# Patient Record
Sex: Male | Born: 1970 | Race: White | Hispanic: No | Marital: Single | Smoking: Current every day smoker
Health system: Southern US, Community
[De-identification: ages and names within clinical notes are randomized; demographics above are authoritative.]

## PROBLEM LIST (undated history)

## (undated) DIAGNOSIS — K219 Gastro-esophageal reflux disease without esophagitis: Secondary | ICD-10-CM

## (undated) DIAGNOSIS — R05 Cough: Secondary | ICD-10-CM

## (undated) DIAGNOSIS — IMO0002 Reserved for concepts with insufficient information to code with codable children: Secondary | ICD-10-CM

## (undated) DIAGNOSIS — C801 Malignant (primary) neoplasm, unspecified: Secondary | ICD-10-CM

## (undated) DIAGNOSIS — R059 Cough, unspecified: Secondary | ICD-10-CM

## (undated) DIAGNOSIS — I10 Essential (primary) hypertension: Secondary | ICD-10-CM

## (undated) DIAGNOSIS — G479 Sleep disorder, unspecified: Secondary | ICD-10-CM

## (undated) HISTORY — PX: APPENDECTOMY: SHX54

## (undated) HISTORY — PX: MELANOMA EXCISION: SHX5266

---

## 2011-11-11 ENCOUNTER — Other Ambulatory Visit: Payer: Self-pay | Admitting: Specialist

## 2011-11-20 ENCOUNTER — Encounter (HOSPITAL_COMMUNITY): Payer: Self-pay | Admitting: Pharmacy Technician

## 2011-11-23 ENCOUNTER — Ambulatory Visit (HOSPITAL_COMMUNITY)
Admission: RE | Admit: 2011-11-23 | Discharge: 2011-11-23 | Disposition: A | Discharge status: Home / Self Care | Payer: Worker's Compensation | Source: Ambulatory Visit | Attending: Specialist | Admitting: Specialist

## 2011-11-23 ENCOUNTER — Encounter (HOSPITAL_COMMUNITY): Payer: Self-pay

## 2011-11-23 ENCOUNTER — Other Ambulatory Visit: Payer: Self-pay

## 2011-11-23 ENCOUNTER — Encounter (HOSPITAL_COMMUNITY)
Admission: RE | Admit: 2011-11-23 | Discharge: 2011-11-23 | Disposition: A | Discharge status: Home / Self Care | Payer: Worker's Compensation | Source: Ambulatory Visit | Attending: Specialist | Admitting: Specialist

## 2011-11-23 DIAGNOSIS — Z01818 Encounter for other preprocedural examination: Secondary | ICD-10-CM | POA: Insufficient documentation

## 2011-11-23 DIAGNOSIS — Z01812 Encounter for preprocedural laboratory examination: Secondary | ICD-10-CM | POA: Insufficient documentation

## 2011-11-23 HISTORY — DX: Cough, unspecified: R05.9

## 2011-11-23 HISTORY — DX: Essential (primary) hypertension: I10

## 2011-11-23 HISTORY — DX: Malignant (primary) neoplasm, unspecified: C80.1

## 2011-11-23 HISTORY — DX: Gastro-esophageal reflux disease without esophagitis: K21.9

## 2011-11-23 HISTORY — DX: Sleep disorder, unspecified: G47.9

## 2011-11-23 HISTORY — DX: Cough: R05

## 2011-11-23 HISTORY — DX: Reserved for concepts with insufficient information to code with codable children: IMO0002

## 2011-11-23 LAB — BASIC METABOLIC PANEL
CO2: 23 mEq/L (ref 19–32)
Calcium: 9.8 mg/dL (ref 8.4–10.5)
Chloride: 100 mEq/L (ref 96–112)
Potassium: 4.3 mEq/L (ref 3.5–5.1)
Sodium: 136 mEq/L (ref 135–145)

## 2011-11-23 LAB — CBC
Hemoglobin: 15.1 g/dL (ref 13.0–17.0)
MCH: 29.5 pg (ref 26.0–34.0)
Platelets: 275 10*3/uL (ref 150–400)
RBC: 5.11 MIL/uL (ref 4.22–5.81)
WBC: 9.2 10*3/uL (ref 4.0–10.5)

## 2011-11-23 LAB — SURGICAL PCR SCREEN
MRSA, PCR: POSITIVE — AB
Staphylococcus aureus: POSITIVE — AB

## 2011-11-23 NOTE — Progress Notes (Signed)
11/23/11 1110  OBSTRUCTIVE SLEEP APNEA  Have you ever been diagnosed with sleep apnea through a sleep study? No  Do you snore loudly (loud enough to be heard through closed doors)?  1  Do you often feel tired, fatigued, or sleepy during the daytime? 1  Has anyone observed you stop breathing during your sleep? 0  Do you have, or are you being treated for high blood pressure? 1  BMI more than 35 kg/m2? 0  Age over 40 years old? 0  Neck circumference greater than 40 cm/18 inches? 0  Gender: 1  Obstructive Sleep Apnea Score 4   Score 4 or greater  No PCP

## 2011-11-23 NOTE — Patient Instructions (Signed)
Sean Mccann  11/23/2011                           YOUR PROCEDURE IS SCHEDULED ON:  11/29/11 AT 8:30 AM               PLEASE REPORT TO SHORT STAY CENTER AT :  6:00 AM               CALL THIS NUMBER IF ANY PROBLEMS THE DAY OF SURGERY :               [phone]                      REMEMBER:   Do not eat food or drink liquids AFTER MIDNIGHT    Take these medicines the morning of surgery with A SIP OF WATER:  WELLBUTRIN / RANITIDINE / HYDROCODONE IF NEEDED   Do not wear jewelry, make-up   Do not wear lotions, powders, or perfumes.   Do not shave legs or underarms 12 hrs. before surgery (men may shave face)  Do not bring valuables to the hospital.  Contacts, dentures or bridgework may not be worn into surgery.  Leave suitcase in the car. After surgery it may be brought to your room.  For patients admitted to the hospital more than one night, checkout time is 11:00                          The day of discharge.   Patients discharged the day of surgery will not be allowed to drive home                             If going home same day of surgery, must have someone stay with you first                           24 hrs at home and arrange for some one to drive you home from hospital.    Special Instructions:   Please read over the following fact sheets that you were given:               1. MRSA  INFORMATION                      2. [place] PREPARING FOR SURGERY SHEET               3. INCENTIVE SPIROMETER                                                            X_____________________________________________________________________

## 2011-11-29 ENCOUNTER — Ambulatory Visit (HOSPITAL_COMMUNITY): Payer: Worker's Compensation

## 2011-11-29 ENCOUNTER — Encounter (HOSPITAL_COMMUNITY)
Admission: RE | Disposition: A | Discharge status: Home / Self Care | Payer: Self-pay | Source: Ambulatory Visit | Attending: Specialist

## 2011-11-29 ENCOUNTER — Observation Stay (HOSPITAL_COMMUNITY)
Admission: RE | Admit: 2011-11-29 | Discharge: 2011-11-29 | Disposition: A | Discharge status: Home / Self Care | Payer: Worker's Compensation | Source: Ambulatory Visit | Attending: Specialist | Admitting: Specialist

## 2011-11-29 ENCOUNTER — Ambulatory Visit (HOSPITAL_COMMUNITY): Payer: Worker's Compensation | Admitting: Anesthesiology

## 2011-11-29 ENCOUNTER — Encounter (HOSPITAL_COMMUNITY): Payer: Self-pay | Admitting: *Deleted

## 2011-11-29 ENCOUNTER — Encounter (HOSPITAL_COMMUNITY): Payer: Self-pay | Admitting: Anesthesiology

## 2011-11-29 DIAGNOSIS — I10 Essential (primary) hypertension: Secondary | ICD-10-CM | POA: Insufficient documentation

## 2011-11-29 DIAGNOSIS — Z85820 Personal history of malignant melanoma of skin: Secondary | ICD-10-CM | POA: Insufficient documentation

## 2011-11-29 DIAGNOSIS — F17200 Nicotine dependence, unspecified, uncomplicated: Secondary | ICD-10-CM | POA: Insufficient documentation

## 2011-11-29 DIAGNOSIS — M5126 Other intervertebral disc displacement, lumbar region: Principal | ICD-10-CM | POA: Insufficient documentation

## 2011-11-29 DIAGNOSIS — K219 Gastro-esophageal reflux disease without esophagitis: Secondary | ICD-10-CM | POA: Insufficient documentation

## 2011-11-29 DIAGNOSIS — Z79899 Other long term (current) drug therapy: Secondary | ICD-10-CM | POA: Insufficient documentation

## 2011-11-29 HISTORY — PX: LUMBAR LAMINECTOMY: SHX95

## 2011-11-29 SURGERY — MICRODISCECTOMY LUMBAR LAMINECTOMY
Anesthesia: General | Laterality: Right | Wound class: Clean

## 2011-11-29 MED ORDER — LISINOPRIL 10 MG PO TABS
10.0000 mg | ORAL_TABLET | Freq: Every day | ORAL | Status: DC
  Administered 2011-11-29: 10 mg via ORAL
  Filled 2011-11-29: qty 1

## 2011-11-29 MED ORDER — CLINDAMYCIN PHOSPHATE 900 MG/50ML IV SOLN
INTRAVENOUS | Status: AC
  Filled 2011-11-29: qty 50

## 2011-11-29 MED ORDER — ACETAMINOPHEN 10 MG/ML IV SOLN
INTRAVENOUS | Status: AC
  Filled 2011-11-29: qty 100

## 2011-11-29 MED ORDER — ONDANSETRON HCL 4 MG/2ML IJ SOLN
INTRAMUSCULAR | Status: DC | PRN
  Administered 2011-11-29 (×2): 2 mg via INTRAVENOUS

## 2011-11-29 MED ORDER — FENTANYL CITRATE 0.05 MG/ML IJ SOLN
INTRAMUSCULAR | Status: DC | PRN
  Administered 2011-11-29 (×2): 25 ug via INTRAVENOUS
  Administered 2011-11-29: 75 ug via INTRAVENOUS
  Administered 2011-11-29: 50 ug via INTRAVENOUS
  Administered 2011-11-29: 25 ug via INTRAVENOUS
  Administered 2011-11-29: 50 ug via INTRAVENOUS

## 2011-11-29 MED ORDER — CEFAZOLIN SODIUM-DEXTROSE 2-3 GM-% IV SOLR
INTRAVENOUS | Status: AC
  Filled 2011-11-29: qty 50

## 2011-11-29 MED ORDER — METHOCARBAMOL 500 MG PO TABS: 500.0000 mg | ORAL_TABLET | Freq: Four times a day (QID) | ORAL | Status: DC | PRN

## 2011-11-29 MED ORDER — THROMBIN 5000 UNITS EX SOLR
CUTANEOUS | Status: DC | PRN
  Administered 2011-11-29: 10000 [IU] via TOPICAL

## 2011-11-29 MED ORDER — METHOCARBAMOL 100 MG/ML IJ SOLN
500.0000 mg | Freq: Four times a day (QID) | INTRAVENOUS | Status: DC | PRN
  Administered 2011-11-29: 500 mg via INTRAVENOUS
  Filled 2011-11-29: qty 5

## 2011-11-29 MED ORDER — CEFAZOLIN SODIUM-DEXTROSE 2-3 GM-% IV SOLR
2.0000 g | Freq: Three times a day (TID) | INTRAVENOUS | Status: DC
  Administered 2011-11-29: 2 g via INTRAVENOUS
  Filled 2011-11-29 (×2): qty 50

## 2011-11-29 MED ORDER — CEFAZOLIN SODIUM-DEXTROSE 2-3 GM-% IV SOLR
2.0000 g | INTRAVENOUS | Status: AC
  Administered 2011-11-29: 2 g via INTRAVENOUS

## 2011-11-29 MED ORDER — CLINDAMYCIN PHOSPHATE 900 MG/50ML IV SOLN
900.0000 mg | Freq: Once | INTRAVENOUS | Status: AC
  Administered 2011-11-29: 900 mg via INTRAVENOUS
  Filled 2011-11-29: qty 50

## 2011-11-29 MED ORDER — MENTHOL 3 MG MT LOZG
1.0000 | LOZENGE | OROMUCOSAL | Status: DC | PRN
  Filled 2011-11-29: qty 9

## 2011-11-29 MED ORDER — BUPROPION HCL ER (XL) 300 MG PO TB24: 300.0000 mg | ORAL_TABLET | Freq: Every day | ORAL | Status: DC

## 2011-11-29 MED ORDER — PANTOPRAZOLE SODIUM 40 MG IV SOLR
40.0000 mg | Freq: Every day | INTRAVENOUS | Status: DC
  Administered 2011-11-29: 40 mg via INTRAVENOUS
  Filled 2011-11-29: qty 40

## 2011-11-29 MED ORDER — DOCUSATE SODIUM 100 MG PO CAPS
100.0000 mg | ORAL_CAPSULE | Freq: Two times a day (BID) | ORAL | Status: DC
  Administered 2011-11-29: 100 mg via ORAL
  Filled 2011-11-29 (×2): qty 1

## 2011-11-29 MED ORDER — PROPOFOL 10 MG/ML IV EMUL
INTRAVENOUS | Status: DC | PRN
  Administered 2011-11-29: 250 mg via INTRAVENOUS

## 2011-11-29 MED ORDER — SODIUM CHLORIDE 0.9 % IR SOLN
Status: DC | PRN
  Administered 2011-11-29: 09:00:00

## 2011-11-29 MED ORDER — OXYCODONE-ACETAMINOPHEN 7.5-325 MG PO TABS: 1.0000 | ORAL_TABLET | ORAL | Status: AC | PRN

## 2011-11-29 MED ORDER — KETOROLAC TROMETHAMINE 30 MG/ML IJ SOLN: 15.0000 mg | Freq: Once | INTRAMUSCULAR | Status: DC | PRN

## 2011-11-29 MED ORDER — ACETAMINOPHEN 10 MG/ML IV SOLN
INTRAVENOUS | Status: DC | PRN
  Administered 2011-11-29: 1000 mg via INTRAVENOUS

## 2011-11-29 MED ORDER — ACETAMINOPHEN 325 MG PO TABS: 650.0000 mg | ORAL_TABLET | ORAL | Status: DC | PRN

## 2011-11-29 MED ORDER — CISATRACURIUM BESYLATE (PF) 10 MG/5ML IV SOLN
INTRAVENOUS | Status: DC | PRN
  Administered 2011-11-29: 1 mg via INTRAVENOUS
  Administered 2011-11-29: 5 mg via INTRAVENOUS
  Administered 2011-11-29: 2 mg via INTRAVENOUS
  Administered 2011-11-29 (×2): 1 mg via INTRAVENOUS

## 2011-11-29 MED ORDER — MIDAZOLAM HCL 5 MG/5ML IJ SOLN
INTRAMUSCULAR | Status: DC | PRN
  Administered 2011-11-29: 2 mg via INTRAVENOUS

## 2011-11-29 MED ORDER — PROMETHAZINE HCL 25 MG/ML IJ SOLN: 6.2500 mg | INTRAMUSCULAR | Status: DC | PRN

## 2011-11-29 MED ORDER — CLINDAMYCIN PHOSPHATE 900 MG/50ML IV SOLN: INTRAVENOUS | Status: DC | PRN

## 2011-11-29 MED ORDER — HYDROMORPHONE HCL PF 1 MG/ML IJ SOLN: 0.5000 mg | INTRAMUSCULAR | Status: DC | PRN

## 2011-11-29 MED ORDER — ACETAMINOPHEN 650 MG RE SUPP: 650.0000 mg | RECTAL | Status: DC | PRN

## 2011-11-29 MED ORDER — HYDROMORPHONE HCL PF 1 MG/ML IJ SOLN
INTRAMUSCULAR | Status: DC | PRN
  Administered 2011-11-29: 0.5 mg via INTRAVENOUS
  Administered 2011-11-29: 1.5 mg via INTRAVENOUS

## 2011-11-29 MED ORDER — LIDOCAINE HCL (CARDIAC) 20 MG/ML IV SOLN
INTRAVENOUS | Status: DC | PRN
  Administered 2011-11-29: 30 mg via INTRAVENOUS

## 2011-11-29 MED ORDER — OXYCODONE-ACETAMINOPHEN 5-325 MG PO TABS
1.0000 | ORAL_TABLET | ORAL | Status: DC | PRN
  Administered 2011-11-29: 2 via ORAL
  Filled 2011-11-29: qty 2

## 2011-11-29 MED ORDER — METHOCARBAMOL 500 MG PO TABS: 500.0000 mg | ORAL_TABLET | Freq: Three times a day (TID) | ORAL | Status: AC

## 2011-11-29 MED ORDER — SUCCINYLCHOLINE CHLORIDE 20 MG/ML IJ SOLN
INTRAMUSCULAR | Status: DC | PRN
  Administered 2011-11-29: 100 mg via INTRAVENOUS

## 2011-11-29 MED ORDER — THROMBIN 5000 UNITS EX SOLR
CUTANEOUS | Status: AC
Start: 2011-11-29 — End: 2011-11-29
  Filled 2011-11-29: qty 10000

## 2011-11-29 MED ORDER — CLINDAMYCIN PHOSPHATE 600 MG/50ML IV SOLN
600.0000 mg | Freq: Three times a day (TID) | INTRAVENOUS | Status: DC
  Administered 2011-11-29: 600 mg via INTRAVENOUS
  Filled 2011-11-29 (×2): qty 50

## 2011-11-29 MED ORDER — ONDANSETRON HCL 4 MG/2ML IJ SOLN: 4.0000 mg | INTRAMUSCULAR | Status: DC | PRN

## 2011-11-29 MED ORDER — CHLORHEXIDINE GLUCONATE 4 % EX LIQD: 60.0000 mL | Freq: Once | CUTANEOUS | Status: DC

## 2011-11-29 MED ORDER — PHENOL 1.4 % MT LIQD
1.0000 | OROMUCOSAL | Status: DC | PRN
  Filled 2011-11-29: qty 177

## 2011-11-29 MED ORDER — HYDROCODONE-ACETAMINOPHEN 5-325 MG PO TABS
1.0000 | ORAL_TABLET | ORAL | Status: DC | PRN
  Administered 2011-11-29: 2 via ORAL
  Filled 2011-11-29: qty 2

## 2011-11-29 MED ORDER — BUPIVACAINE-EPINEPHRINE 0.5% -1:200000 IJ SOLN
INTRAMUSCULAR | Status: DC | PRN
  Administered 2011-11-29: 30 mL

## 2011-11-29 MED ORDER — LACTATED RINGERS IV SOLN
INTRAVENOUS | Status: DC | PRN
  Administered 2011-11-29 (×2): via INTRAVENOUS

## 2011-11-29 MED ORDER — LACTATED RINGERS IV SOLN: INTRAVENOUS | Status: DC

## 2011-11-29 MED ORDER — HYDROMORPHONE HCL PF 1 MG/ML IJ SOLN: 0.2500 mg | INTRAMUSCULAR | Status: DC | PRN

## 2011-11-29 MED ORDER — BUPIVACAINE-EPINEPHRINE (PF) 0.5% -1:200000 IJ SOLN
INTRAMUSCULAR | Status: AC
  Filled 2011-11-29: qty 10

## 2011-11-29 MED ORDER — SODIUM CHLORIDE 0.45 % IV SOLN: INTRAVENOUS | Status: DC

## 2011-11-29 SURGICAL SUPPLY — 45 items
BAG ZIPLOCK 12X15 (MISCELLANEOUS) ×2 IMPLANT
BENZOIN TINCTURE PRP APPL 2/3 (GAUZE/BANDAGES/DRESSINGS) ×2 IMPLANT
CHLORAPREP W/TINT 26ML (MISCELLANEOUS) IMPLANT
CLEANER TIP ELECTROSURG 2X2 (MISCELLANEOUS) ×2 IMPLANT
CLOTH BEACON ORANGE TIMEOUT ST (SAFETY) ×2 IMPLANT
CLSR STERI-STRIP ANTIMIC 1/2X4 (GAUZE/BANDAGES/DRESSINGS) ×2 IMPLANT
DECANTER SPIKE VIAL GLASS SM (MISCELLANEOUS) ×2 IMPLANT
DRAPE MICROSCOPE LEICA (MISCELLANEOUS) ×2 IMPLANT
DRAPE POUCH INSTRU U-SHP 10X18 (DRAPES) ×2 IMPLANT
DRAPE SURG 17X11 SM STRL (DRAPES) ×2 IMPLANT
DRSG EMULSION OIL 3X3 NADH (GAUZE/BANDAGES/DRESSINGS) IMPLANT
DRSG PAD ABDOMINAL 8X10 ST (GAUZE/BANDAGES/DRESSINGS) IMPLANT
DRSG TELFA 4X5 ISLAND ADH (GAUZE/BANDAGES/DRESSINGS) ×2 IMPLANT
DURAPREP 26ML APPLICATOR (WOUND CARE) ×2 IMPLANT
ELECT REM PT RETURN 9FT ADLT (ELECTROSURGICAL) ×2
ELECTRODE REM PT RTRN 9FT ADLT (ELECTROSURGICAL) ×1 IMPLANT
GLOVE BIOGEL PI IND STRL 8 (GLOVE) ×1 IMPLANT
GLOVE BIOGEL PI INDICATOR 8 (GLOVE) ×1
GLOVE ECLIPSE 6.5 STRL STRAW (GLOVE) ×2 IMPLANT
GLOVE INDICATOR 6.5 STRL GRN (GLOVE) ×2 IMPLANT
GLOVE SURG SS PI 8.0 STRL IVOR (GLOVE) ×4 IMPLANT
GOWN STRL NON-REIN LRG LVL3 (GOWN DISPOSABLE) ×2 IMPLANT
GOWN STRL REIN XL XLG (GOWN DISPOSABLE) ×4 IMPLANT
KIT BASIN OR (CUSTOM PROCEDURE TRAY) ×2 IMPLANT
KIT POSITIONING SURG ANDREWS (MISCELLANEOUS) ×2 IMPLANT
MANIFOLD NEPTUNE II (INSTRUMENTS) ×2 IMPLANT
NEEDLE SPNL 18GX3.5 QUINCKE PK (NEEDLE) ×6 IMPLANT
PATTIES SURGICAL .5 X.5 (GAUZE/BANDAGES/DRESSINGS) IMPLANT
PATTIES SURGICAL .75X.75 (GAUZE/BANDAGES/DRESSINGS) IMPLANT
PATTIES SURGICAL 1X1 (DISPOSABLE) IMPLANT
SPONGE SURGIFOAM ABS GEL 100 (HEMOSTASIS) ×2 IMPLANT
STAPLER VISISTAT (STAPLE) IMPLANT
STRIP CLOSURE SKIN 1/2X4 (GAUZE/BANDAGES/DRESSINGS) IMPLANT
SUT PROLENE 3 0 PS 2 (SUTURE) IMPLANT
SUT VIC AB 0 CT1 27 (SUTURE)
SUT VIC AB 0 CT1 27XBRD ANTBC (SUTURE) IMPLANT
SUT VIC AB 1 CT1 27 (SUTURE) ×2
SUT VIC AB 1 CT1 27XBRD ANTBC (SUTURE) ×2 IMPLANT
SUT VIC AB 1-0 CT2 27 (SUTURE) IMPLANT
SUT VIC AB 2-0 CT1 27 (SUTURE) ×1
SUT VIC AB 2-0 CT1 TAPERPNT 27 (SUTURE) ×1 IMPLANT
SUT VICRYL 0 UR6 27IN ABS (SUTURE) IMPLANT
SYR CONTROL 10ML LL (SYRINGE) ×2 IMPLANT
TRAY LAMINECTOMY (CUSTOM PROCEDURE TRAY) ×2 IMPLANT
YANKAUER SUCT BULB TIP NO VENT (SUCTIONS) ×2 IMPLANT

## 2011-11-29 NOTE — Brief Op Note (Signed)
11/29/2011  10:07 AM  PATIENT:  Sean Mccann  41 y.o. 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  PROCEDURE:  Procedure(s) (LRB) with comments: MICRODISCECTOMY LUMBAR LAMINECTOMY (Right) - L5-S1 RIGHT  SURGEON:  Surgeon(s) and Role:    * Javier Docker, MD - Primary  PHYSICIAN ASSISTANT:   ASSISTANTS: Irish Elders   ANESTHESIA:   general  EBL:     BLOOD ADMINISTERED:none  DRAINS: none   LOCAL MEDICATIONS USED:  MARCAINE     SPECIMEN:  Source of Specimen:  [id number]  DISPOSITION OF SPECIMEN:  PATHOLOGY  COUNTS:  YES  TOURNIQUET:  * No tourniquets in log *  DICTATION: .Other Dictation: Dictation Number [id number]  PLAN OF CARE: Admit for overnight observation  PATIENT DISPOSITION:  PACU - hemodynamically stable.   Delay start of Pharmacological VTE agent (>24hrs) due to surgical blood loss or risk of bleeding: yes

## 2011-11-29 NOTE — Care Management Note (Signed)
    Page 1 of 1   11/29/2011     4:11:24 PM   CARE MANAGEMENT NOTE 11/29/2011  Patient:  MONTAE, STAGER   Account Number:  [id number]  Date Initiated:  11/29/2011  Documentation initiated by:  Lorenda Ishihara  Subjective/Objective Assessment:   41 yo male admitted s/p laminectomy     Action/Plan:   home when stable   Anticipated DC Date:  11/29/2011   Anticipated DC Plan:  HOME/SELF CARE      DC Planning Services  CM consult      Choice offered to / List presented to:             Status of service:  Completed, signed off Medicare Important Message given?   (If response is "NO", the following Medicare IM given date fields will be blank) Date Medicare IM given:   Date Additional Medicare IM given:    Discharge Disposition:  HOME/SELF CARE  Per UR Regulation:  Reviewed for med. necessity/level of care/duration of stay  If discussed at Long Length of Stay Meetings, dates discussed:    Comments:  11-29-11 Lorenda Ishihara RN CM 1600 Mobilizing well. Pt states he plans to start OP PT in 2 weeks after follow-up with MD. No home health needs. 1x eval. PT Assessment,  Patent does not need any further PT services. Patient does not need any further OT services

## 2011-11-29 NOTE — Anesthesia Preprocedure Evaluation (Signed)
Anesthesia Evaluation  Patient identified by MRN, date of birth, ID band Patient awake    Reviewed: Allergy & Precautions, H&P , NPO status , Patient's Chart, lab work & pertinent test results  Airway Mallampati: II TM Distance: >3 FB Neck ROM: Full    Dental No notable dental hx.    Pulmonary Current Smoker,  breath sounds clear to auscultation  Pulmonary exam normal       Cardiovascular hypertension, Pt. on medications Rhythm:Regular Rate:Normal     Neuro/Psych negative neurological ROS  negative psych ROS   GI/Hepatic Neg liver ROS, GERD-  Medicated,  Endo/Other  negative endocrine ROS  Renal/GU negative Renal ROS  negative genitourinary   Musculoskeletal negative musculoskeletal ROS (+)   Abdominal   Peds negative pediatric ROS (+)  Hematology negative hematology ROS (+)   Anesthesia Other Findings   Reproductive/Obstetrics negative OB ROS                           Anesthesia Physical Anesthesia Plan  ASA: II  Anesthesia Plan: General   Post-op Pain Management:    Induction: Intravenous  Airway Management Planned: Oral ETT  Additional Equipment:   Intra-op Plan:   Post-operative Plan: Extubation in OR  Informed Consent: I have reviewed the patients History and Physical, chart, labs and discussed the procedure including the risks, benefits and alternatives for the proposed anesthesia with the patient or authorized representative who has indicated his/her understanding and acceptance.   Dental advisory given  Plan Discussed with: CRNA and Surgeon  Anesthesia Plan Comments:         Anesthesia Quick Evaluation

## 2011-11-29 NOTE — Anesthesia Postprocedure Evaluation (Signed)
  Anesthesia Post-op Note  Patient: Sean Mccann  Procedure(s) Performed: Procedure(s) (LRB): MICRODISCECTOMY LUMBAR LAMINECTOMY (Right)  Patient Location: PACU  Anesthesia Type: General  Level of Consciousness: awake and alert   Airway and Oxygen Therapy: Patient Spontanous Breathing  Post-op Pain: mild  Post-op Assessment: Post-op Vital signs reviewed, Patient's Cardiovascular Status Stable, Respiratory Function Stable, Patent Airway and No signs of Nausea or vomiting  Post-op Vital Signs: stable  Complications: No apparent anesthesia complications

## 2011-11-29 NOTE — Evaluation (Signed)
Occupational Therapy Evaluation Patient Details Name: Sean Mccann MRN: [id number] DOB: 05-03-1970 Today's Date: 11/29/2011 Time: 4098-1191 OT Time Calculation (min): 19 min  OT Assessment / Plan / Recommendation Clinical Impression  This 41 year old man was admitted for L5-S1 decompression.  All education was completed and pt verbalizes all.  He does not need DME nor follow up OT    OT Assessment  Patient does not need any further OT services    Follow Up Recommendations  No OT follow up    Barriers to Discharge      Equipment Recommendations  None recommended by PT/OT    Recommendations for Other Services    Frequency       Precautions / Restrictions Precautions Precautions: Back Precaution Booklet Issued: Yes (comment) Precaution Comments: Verbally reviewed and demonstrated back precautions.  Restrictions Weight Bearing Restrictions: No   Pertinent Vitals/Pain Back sore only--repositioned    ADL  Grooming: Simulated;Supervision/safety Where Assessed - Grooming: Unsupported standing Upper Body Bathing: Simulated;Set up Where Assessed - Upper Body Bathing: Unsupported sitting Lower Body Bathing: Simulated;Supervision/safety (with reacher) Where Assessed - Lower Body Bathing: Supported sit to stand Upper Body Dressing: Simulated;Supervision/safety Where Assessed - Upper Body Dressing: Unsupported sitting Lower Body Dressing: Performed;Moderate assistance Where Assessed - Lower Body Dressing: Unsupported sit to stand Toilet Transfer: Performed;Supervision/safety Toilet Transfer Method: Sit to Barista: Comfort height toilet;Grab bars (has sink next to commode) Toileting - Clothing Manipulation and Hygiene: Simulated;Supervision/safety Where Assessed - Engineer, mining and Hygiene: Sit to stand from 3-in-1 or toilet Tub/Shower Transfer: Simulated;Supervision/safety Tub/Shower Transfer Method: Land:  (tub) Transfers/Ambulation Related to ADLs: supervision ambulating in room ADL Comments: reviewed back precautions with adls, ae, alternative methods to put pants on (supine), and gave handout.  Pt verbalies all education    OT Diagnosis:    OT Problem List:   OT Treatment Interventions:     OT Goals    Visit Information  Last OT Received On: 11/29/11 Assistance Needed: +1 PT/OT Co-Evaluation/Treatment: Yes    Subjective Data  Subjective: I already feel better Patient Stated Goal: get home:  will stay with mother first   Prior Functioning     Home Living Lives With: Alone Type of Home: House Home Access: Stairs to enter Entrance Stairs-Number of Steps: 3 Entrance Stairs-Rails: None Home Layout: One level Bathroom Shower/Tub: Engineer, manufacturing systems: Handicapped height (comfort height) Home Adaptive Equipment: Straight cane Additional Comments: mother's house: 1 lleve, 3 steps + rail.  Prior Function Level of Independence: Independent Able to Take Stairs?: Yes Driving: Yes Vocation: Part time employment Communication Communication: No difficulties         Vision/Perception     Cognition  Overall Cognitive Status: Appears within functional limits for tasks assessed/performed Arousal/Alertness: Awake/alert Orientation Level: Appears intact for tasks assessed Behavior During Session: [facility] for tasks performed    Extremity/Trunk Assessment Right Upper Extremity Assessment RUE ROM/Strength/Tone: [facility] for tasks assessed Left Upper Extremity Assessment LUE ROM/Strength/Tone: WFL for tasks assessed Trunk Assessment Trunk Assessment: Normal     Mobility Bed Mobility Bed Mobility: Rolling Left;Left Sidelying to Sit Rolling Left: 5: Supervision Left Sidelying to Sit: 5: Supervision Details for Bed Mobility Assistance: VCs safety, technique.  Transfers Sit to Stand: 5: Supervision;From bed Stand to Sit: 5: Supervision;To bed Details for  Transfer Assistance: VCs safety, hand placement.      Shoulder Instructions     Exercise     Balance     End  of Session OT - End of Session Activity Tolerance: Patient tolerated treatment well Patient left: in bed;with call bell/phone within reach;with family/visitor present  GO Functional Assessment Tool Used: clinical observation/judgment Functional Limitation: Self care Self Care Current Status (Z6109): At least 20 percent but less than 40 percent impaired, limited or restricted Self Care Goal Status (U0454): At least 20 percent but less than 40 percent impaired, limited or restricted Self Care Discharge Status [phone]): At least 20 percent but less than 40 percent impaired, limited or restricted   Sean Mccann 11/29/2011, 4:01 PM Marica Otter, OTR/L [phone] 11/29/2011

## 2011-11-29 NOTE — Evaluation (Signed)
Physical Therapy Evaluation Patient Details Name: Sean Mccann MRN: [id number] DOB: Apr 02, 1970 Today's Date: 11/29/2011 Time: 7829-5621 PT Time Calculation (min): 23 min  PT Assessment / Plan / Recommendation Clinical Impression  41 yo male s/p lumbar laminectomy L5-S1. POD 0. Mobilizing well. Reviewed all education. Pt states he plans to start OP PT in 2 weeks after follow-up with MD. No home health needs. 1x eval.    PT Assessment  Patent does not need any further PT services    Follow Up Recommendations  No PT follow up    Does the patient have the potential to tolerate intense rehabilitation      Barriers to Discharge        Equipment Recommendations  None recommended by PT    Recommendations for Other Services     Frequency      Precautions / Restrictions Precautions Precautions: Back Precaution Comments: Verbally reviewed and demonstrated back precautions.  Restrictions Weight Bearing Restrictions: No   Pertinent Vitals/Pain 2/10 low back      Mobility  Bed Mobility Bed Mobility: Rolling Left;Left Sidelying to Sit Rolling Left: 5: Supervision Left Sidelying to Sit: 5: Supervision Details for Bed Mobility Assistance: VCs safety, technique.  Transfers Transfers: Sit to Stand;Stand to Sit Sit to Stand: 5: Supervision;From bed Stand to Sit: 5: Supervision;To bed Details for Transfer Assistance: VCs safety, hand placement.  Ambulation/Gait Ambulation/Gait Assistance: 5: Supervision Ambulation Distance (Feet): 200 Feet Assistive device: None Ambulation/Gait Assistance Details: Guarded gait pattern: slow gait speed, little to no arm swing.  Gait Pattern: Step-through pattern Stairs: Yes Stairs Assistance: 5: Supervision Stairs Assistance Details (indicate cue type and reason): VCs safety, technique.  Stair Management Technique: One rail Left;Step to pattern;Forwards Number of Stairs: 3     Shoulder Instructions     Exercises     PT Diagnosis:    PT  Problem List:   PT Treatment Interventions:     PT Goals    Visit Information  Last PT Received On: 11/29/11 Assistance Needed: +1    Subjective Data  Subjective: "It hurts less new than it did before the surgery" Patient Stated Goal: Home.    Prior Functioning  Home Living Lives With: Alone Type of Home: House Home Access: Stairs to enter Entrance Stairs-Number of Steps: 3 Entrance Stairs-Rails: None Home Layout: One level Bathroom Shower/Tub: Tub/shower unit Home Adaptive Equipment: Straight cane Additional Comments: mother's house: 1 lleve, 3 steps + rail.  Prior Function Level of Independence: Independent Able to Take Stairs?: Yes Driving: Yes Vocation: Part time employment Communication Communication: No difficulties    Cognition  Overall Cognitive Status: Appears within functional limits for tasks assessed/performed Arousal/Alertness: Awake/alert Orientation Level: Appears intact for tasks assessed Behavior During Session: [facility] for tasks performed    Extremity/Trunk Assessment Right Lower Extremity Assessment RLE ROM/Strength/Tone: [facility] for tasks assessed RLE Sensation: WFL - Light Touch Left Lower Extremity Assessment LLE ROM/Strength/Tone: WFL for tasks assessed LLE Sensation: WFL - Light Touch Trunk Assessment Trunk Assessment: Normal   Balance    End of Session PT - End of Session Activity Tolerance: Patient tolerated treatment well Patient left: in bed;with call bell/phone within reach;with family/visitor present  GP Functional Assessment Tool Used: Clinical judgement Functional Limitation: Mobility: Walking and moving around Mobility: Walking and Moving Around Current Status (H0865): At least 1 percent but less than 20 percent impaired, limited or restricted Mobility: Walking and Moving Around Goal Status [phone]): At least 1 percent but less than 20 percent impaired, limited or restricted  Mobility: Walking and Moving Around Discharge Status [phone]):  At least 1 percent but less than 20 percent impaired, limited or restricted   Sean Mccann 11/29/2011, 4:00 PM [phone]

## 2011-11-29 NOTE — H&P (Signed)
Sean Mccann is an 41 y.o. male.   Chief Complaint: Right leg pain HPI: HNP L5 S1 refractory with leg pain numbness.  Past Medical History  Diagnosis Date  . Hypertension   . Cough     "SMOKERS COUGH"  . Herniated disc   . Cancer     HISTORY MELANOMA  . GERD (gastroesophageal reflux disease)   . Difficulty sleeping     Past Surgical History  Procedure Date  . Melanoma excision     FROM BACK  . Appendectomy     History reviewed. No pertinent family history. Social History:  reports that he has been smoking.  He does not have any smokeless tobacco history on file. He reports that he drinks alcohol. He reports that he does not use illicit drugs.  Allergies: No Known Allergies  Medications Prior to Admission  Medication Sig Dispense Refill  . buPROPion (WELLBUTRIN XL) 300 MG 24 hr tablet Take 300 mg by mouth daily before breakfast.      . HYDROcodone-acetaminophen (NORCO) 7.5-325 MG per tablet Take 1 tablet by mouth every 6 (six) hours as needed.      Marland Kitchen ibuprofen (ADVIL,MOTRIN) 200 MG tablet Take 400 mg by mouth every 6 (six) hours as needed. Pain      . lisinopril (PRINIVIL,ZESTRIL) 10 MG tablet Take 10 mg by mouth daily before breakfast.      . Melatonin 3 MG TBDP Take by mouth.      . Multiple Vitamin (MULTIVITAMIN WITH MINERALS) TABS Take 1 tablet by mouth daily.      . ranitidine (ZANTAC) 75 MG tablet Take 75 mg by mouth 2 (two) times daily.        No results found for this or any previous visit (from the past 48 hour(s)). No results found.  Review of Systems  Neurological: Positive for sensory change and focal weakness.  All other systems reviewed and are negative.    Blood pressure 123/77, pulse 62, temperature 98.2 F (36.8 C), temperature source Oral, resp. rate 18, SpO2 96.00%. Physical Exam  Vitals reviewed. Constitutional: He is oriented to person, place, and time. He appears well-developed.  HENT:  Head: Normocephalic.  Eyes: Pupils are equal, round,  and reactive to light.  Neck: Normal range of motion.  Cardiovascular: Normal rate.   Respiratory: Effort normal.  GI: Soft.  Musculoskeletal: Normal range of motion.       + SLR right. 5-/5 EHL right. Decreased sensation S1 right.  Neurological: He is alert and oriented to person, place, and time.  Skin: Skin is warm and dry.  MRI HNP L5S1 right.   Assessment/Plan L5S1 radiculopathy right refractory due to HNP L5S1. Plan decompression. Risks discussed. MRSA+. Plan clindamycin.  Sean Mccann C 11/29/2011, 8:20 AM

## 2011-11-29 NOTE — Transfer of Care (Signed)
Immediate Anesthesia Transfer of Care Note  Patient: Sean Mccann  Procedure(s) Performed: Procedure(s) (LRB) with comments: MICRODISCECTOMY LUMBAR LAMINECTOMY (Right) - L5-S1 RIGHT  Patient Location: PACU  Anesthesia Type:General  Level of Consciousness: awake, alert , oriented and patient cooperative  Airway & Oxygen Therapy: Patient Spontanous Breathing and Patient connected to face mask oxygen  Post-op Assessment: Report given to PACU RN and Post -op Vital signs reviewed and stable  Post vital signs: Reviewed and stable  Complications: No apparent anesthesia complications

## 2011-11-29 NOTE — Progress Notes (Signed)
Patient discharge home discharge instructions and prescriptions reviewed with patient. Educated patient on how to change dressing verbalized understanding. Patient has no questions at this time.

## 2011-11-30 ENCOUNTER — Encounter (HOSPITAL_COMMUNITY): Payer: Self-pay | Admitting: Specialist

## 2011-11-30 NOTE — Discharge Summary (Signed)
Physician Discharge Summary   Patient ID: Sean Mccann MRN: [id number] DOB/AGE: 1970-05-07 41 y.o.  Admit date: 11/29/2011 Discharge date: 11/30/2011  Primary Diagnosis:   H&P L5-S1 RIGHT  Admission Diagnoses:  Past Medical History  Diagnosis Date  . Hypertension   . Cough     "SMOKERS COUGH"  . Herniated disc   . GERD (gastroesophageal reflux disease)   . Difficulty sleeping   . Cancer     HISTORY MELANOMA   Discharge Diagnoses:   Active Problems:  HNP (herniated nucleus pulposus), lumbar  Procedure:  Procedure(s) (LRB): MICRODISCECTOMY LUMBAR LAMINECTOMY (Right)   Consults: None  HPI:  See notes    Laboratory Data: Hospital Outpatient Visit on 11/23/2011  Component Date Value Range Status  . MRSA, PCR 11/23/2011 POSITIVE* NEGATIVE Final   Comment: RESULT CALLED TO, READ BACK BY AND VERIFIED WITH:                          ROBINSON,K. AT 1515 ON [id number] BY LOVE,T.  . Staphylococcus aureus 11/23/2011 POSITIVE* NEGATIVE Final   Comment:                                 The Xpert SA Assay (FDA                          approved for NASAL specimens                          in patients over 56 years of age),                          is one component of                          a comprehensive surveillance                          program.  Test performance has                          been validated by [employer] for patients greater                          than or equal to 80 year old.                          It is not intended                          to diagnose infection nor to                          guide or monitor treatment.  . WBC 11/23/2011 9.2  4.0 - 10.5 K/uL Final  . RBC 11/23/2011 5.11  4.22 - 5.81 MIL/uL Final  . Hemoglobin 11/23/2011 15.1  13.0 - 17.0 g/dL Final  . HCT 11/12/2534 43.9  39.0 - 52.0 % Final  . MCV  11/23/2011 85.9  78.0 - 100.0 fL Final  . MCH 11/23/2011 29.5  26.0 - 34.0 pg Final  . MCHC 11/23/2011 34.4   30.0 - 36.0 g/dL Final  . RDW 47/82/9562 12.5  11.5 - 15.5 % Final  . Platelets 11/23/2011 275  150 - 400 K/uL Final  . Sodium 11/23/2011 136  135 - 145 mEq/L Final  . Potassium 11/23/2011 4.3  3.5 - 5.1 mEq/L Final  . Chloride 11/23/2011 100  96 - 112 mEq/L Final  . CO2 11/23/2011 23  19 - 32 mEq/L Final  . Glucose, Bld 11/23/2011 96  70 - 99 mg/dL Final  . BUN 13/08/6576 10  6 - 23 mg/dL Final  . Creatinine, Ser 11/23/2011 0.85  0.50 - 1.35 mg/dL Final  . Calcium 46/96/2952 9.8  8.4 - 10.5 mg/dL Final  . GFR calc non Af Amer 11/23/2011 >90  >90 mL/min Final  . GFR calc Af Amer 11/23/2011 >90  >90 mL/min Final   Comment:                                 The eGFR has been calculated                          using the CKD EPI equation.                          This calculation has not been                          validated in all clinical                          situations.                          eGFR's persistently                          <90 mL/min signify                          possible Chronic Kidney Disease.   No results found for this basename: HGB:5 in the last 72 hours No results found for this basename: WBC:2,RBC:2,HCT:2,PLT:2 in the last 72 hours No results found for this basename: NA:2,K:2,CL:2,CO2:2,BUN:2,CREATININE:2,GLUCOSE:2,CALCIUM:2 in the last 72 hours No results found for this basename: LABPT:2,INR:2 in the last 72 hours  X-Rays:Dg Chest 2 View  11/23/2011  *RADIOLOGY REPORT*  Clinical Data: Preop lumbar microdiskectomy  CHEST - 2 VIEW  Comparison: None  Findings: The heart size and mediastinal contours are within normal limits.  Both lungs are clear.  The visualized skeletal structures are unremarkable.  IMPRESSION: Negative exam.   Original Report Authenticated By: Signa Kell, M.D.    Dg Spine Portable 1 View  11/29/2011  *RADIOLOGY REPORT*  Clinical Data: L5-S1 surgical level.  PORTABLE SPINE - 1 VIEW  Comparison: Intraoperative views earlier the same  date.  Findings: Cross-table lateral view labeled #3 at 0954 hours demonstrates skin spreaders posterior to the upper sacrum.  A blunt surgical instrument overlies the posterior aspect of the S1 segment.  IMPRESSION: Intraoperative localization as described.   Original Report Authenticated By: Carey Bullocks, M.D.  Dg Spine Portable 1 View  11/29/2011  *RADIOLOGY REPORT*  Clinical Data: Intraop spine surgery  PORTABLE SPINE - 1 VIEW  Comparison: 11/29/2011 at 0901 hrs  Findings: Single lateral view demonstrates surgical instruments posterior to the S1 vertebral body.  IMPRESSION: Lumbar levels as above.   Original Report Authenticated By: Charline Bills, M.D.    Dg Spine Portable 1 View  11/29/2011  *RADIOLOGY REPORT*  Clinical Data: Intraoperative exam for the lumbosacral decompression  PORTABLE SPINE - 1 VIEW  Comparison: None.  Findings: Single lateral view of the lumbar spine was obtained and reveals five lumbar type vertebral bodies.  Mild disc space narrowing is noted at L5 S1.  A needle is noted within the posterior soft tissues at the S1 level.   Original Report Authenticated By: Alcide Clever, M.D.     EKG: Orders placed in visit on 11/23/11  . EKG 12-LEAD     Hospital Course: Patient was admitted to [facility] and taken to the OR and underwent the above state procedure without complications.  Patient tolerated the procedure well and was later transferred to the recovery room and then to the orthopaedic floor for postoperative care.  They were given PO and IV analgesics for pain control following their surgery.  They were given 24 hours of postoperative antibiotics.   PT was consulted postop to assist with mobility and transfers.  The patient was allowed to be WBAT with therapy and was taught back precautions. Discharge planning was consulted to help with postop disposition and equipment needs. . D/C'd same day.  They were given discharge instructions and dressing directions.   They were instructed on when to follow up in the office with Dr. Shelle Iron.  Discharge Medications: Prior to Admission medications   Medication Sig Start Date End Date Taking? Authorizing Provider  buPROPion (WELLBUTRIN XL) 300 MG 24 hr tablet Take 300 mg by mouth daily before breakfast.   Yes Historical Provider, MD  lisinopril (PRINIVIL,ZESTRIL) 10 MG tablet Take 10 mg by mouth daily before breakfast.   Yes Historical Provider, MD  Melatonin 3 MG TBDP Take by mouth.   Yes Historical Provider, MD  methocarbamol (ROBAXIN) 500 MG tablet Take 1 tablet (500 mg total) by mouth 3 (three) times daily. 11/29/11   Javier Docker, MD  oxyCODONE-acetaminophen (PERCOCET) 7.5-325 MG per tablet Take 1-2 tablets by mouth every 4 (four) hours as needed for pain. 11/29/11   Javier Docker, MD  ranitidine (ZANTAC) 75 MG tablet Take 75 mg by mouth 2 (two) times daily.   Yes Historical Provider, MD    Diet: Regular diet Activity:WBAT Follow-up:in 10 days Disposition - Home Discharged Condition: good   Discharge Orders    Future Orders Please Complete By Expires   Diet - low sodium heart healthy      Call MD / Call 911      Comments:   If you experience chest pain or shortness of breath, CALL 911 and be transported to the hospital emergency room.  If you develope a fever above 101 F, pus (white drainage) or increased drainage or redness at the wound, or calf pain, call your surgeon's office.   Constipation Prevention      Comments:   Drink plenty of fluids.  Prune juice may be helpful.  You may use a stool softener, such as Colace (over the counter) 100 mg twice a day.  Use MiraLax (over the counter) for constipation as needed.   Increase activity slowly as tolerated  Medication List     As of 11/30/2011  7:28 AM    STOP taking these medications         HYDROcodone-acetaminophen 7.5-325 MG per tablet   Commonly known as: NORCO      ibuprofen 200 MG tablet   Commonly known as:  ADVIL,MOTRIN      multivitamin with minerals Tabs      TAKE these medications         buPROPion 300 MG 24 hr tablet   Commonly known as: WELLBUTRIN XL   Take 300 mg by mouth daily before breakfast.      lisinopril 10 MG tablet   Commonly known as: PRINIVIL,ZESTRIL   Take 10 mg by mouth daily before breakfast.      Melatonin 3 MG Tbdp   Take by mouth.      methocarbamol 500 MG tablet   Commonly known as: ROBAXIN   Take 1 tablet (500 mg total) by mouth 3 (three) times daily.      oxyCODONE-acetaminophen 7.5-325 MG per tablet   Commonly known as: PERCOCET   Take 1-2 tablets by mouth every 4 (four) hours as needed for pain.      ranitidine 75 MG tablet   Commonly known as: ZANTAC   Take 75 mg by mouth 2 (two) times daily.           Follow-up Information    Follow up with Alesandro Stueve C, MD. In 10 days.   Contact information:   [address] [place] [address] [phone]          Signed: Javier Docker 11/30/2011, 7:28 AM

## 2011-11-30 NOTE — Op Note (Signed)
NAME:  Sean Mccann, Sean Mccann NO.:  [id number]  MEDICAL RECORD NO.:  [id number]  LOCATION:  1526                         FACILITY:  [facility]  PHYSICIAN:  Jene Every, M.D.    DATE OF BIRTH:  23-Oct-1970  DATE OF PROCEDURE:  11/29/2011 DATE OF DISCHARGE:  11/29/2011                              OPERATIVE REPORT   PREOPERATIVE DIAGNOSES:  Spinal stenosis.  Herniated nucleus pulposus, L5-S1, right.  POSTOPERATIVE DIAGNOSES:  Spinal stenosis.  Herniated nucleus pulposus, L5-S1, right.  PROCEDURE PERFORMED: 1. Micro lumbar decompression, L5-S1, right. 2. Foraminotomy, L5 and S1, right. 3. Microdiskectomy, L5-S1, right.  ANESTHESIA:  General.  ASSISTANT:  Lanna Poche, PA, who is a new PA on staff.  ANESTHESIA:  General.  BRIEF HISTORY:  A 41 year old, refractory right lower extremity radicular pain secondary to HNP, lateral recess stenosis, indicated for decompression and failing conservative treatment.  Risks and benefits were discussed including bleeding, infection, damage to neurovascular structures, DVT, PE, anesthetic complications, etc.  TECHNIQUE:  With the patient in supine position after induction of adequate general anesthesia, 2 g of Kefzol and 900 mg of clindamycin due to positive MRSA, placed prone on the [place] frame.  All bony prominences were well padded.  Lumbar region was prepped and draped in usual sterile fashion.  An 18-gauge spinal needle was utilized to localize the 5-1 interspace confirmed with x-ray.  Incision was made from the spinous process of 5-1.  Subcutaneous tissue was dissected, electrocautery was utilized to achieve hemostasis.  Dorsolumbar fascia was identified, divided in line with skin incision.  Paraspinous muscle was elevated from the lamina of L5 and S1.  McCullough retractor was placed.  Operating microscope was draped and brought in the surgical field.  Confirmatory radiograph was obtained.  Next, the  ligamentum flavum was detached from the cephalad edge of S1 utilizing straight microcurette.  Neuro patty was placed beneath the ligamentum flavum, and the foraminotomy of S1 was performed.  Ligamentum flavum was detached from the caudad edge of 5, performed a hemilaminotomy.  Ligamentum flavum was removed from the interspace.  S1 nerve root was found to be displaced into the lateral recess, it was gently mobilized medially. Decompressed the lateral recess to medial border of the pedicle and found a focal HNP confirmed by radiograph.  Performed an annulotomy. Copious portion of the disk material was removed from the disk space, which was straightened up by upbiting pituitary.  Diskectomy of herniated material was performed.  Checked beneath the thecal sac, the axilla, and the shoulder of the root of the foramen.  There was no residual disk herniation compressing the root.  We had performed a foraminotomy of 5 as well as slight stenosis noted due to this mild disk degeneration.  Bipolar electrocautery was utilized to achieve hemostasis.  Confirmatory radiograph was obtained, irrigated the disk space with antibiotic irrigation.  Removed the [facility] retractor. Irrigation was performed.  No evidence of active bleeding and CSF leakage.  I closed the dorsolumbar fascia with 0 Vicryl interrupted figure-of-8 sutures, subcu with 2-0 Vicryl simple sutures.  Skin was reapproximated with 4-0 subcuticular Prolene.  Wound was reinforced with Steri-Strips.  Sterile dressing was applied.  Placed  supine on the hospital bed, extubated without difficulty, and transported to the recovery room in satisfactory condition.  The patient tolerated the procedure well.  No complications.  Assistant, Lanna Poche, [place].     Jene Every, M.D.     [name]  D:  11/29/2011  T:  11/30/2011  Job:  [id number]

## 2014-06-25 IMAGING — CR DG CHEST 2V
2 series · 2 of 2 positions shown · non-contrast
Comparison: None

CLINICAL DATA: Preop lumbar microdiskectomy

CHEST - 2 VIEW

[w chest pa]
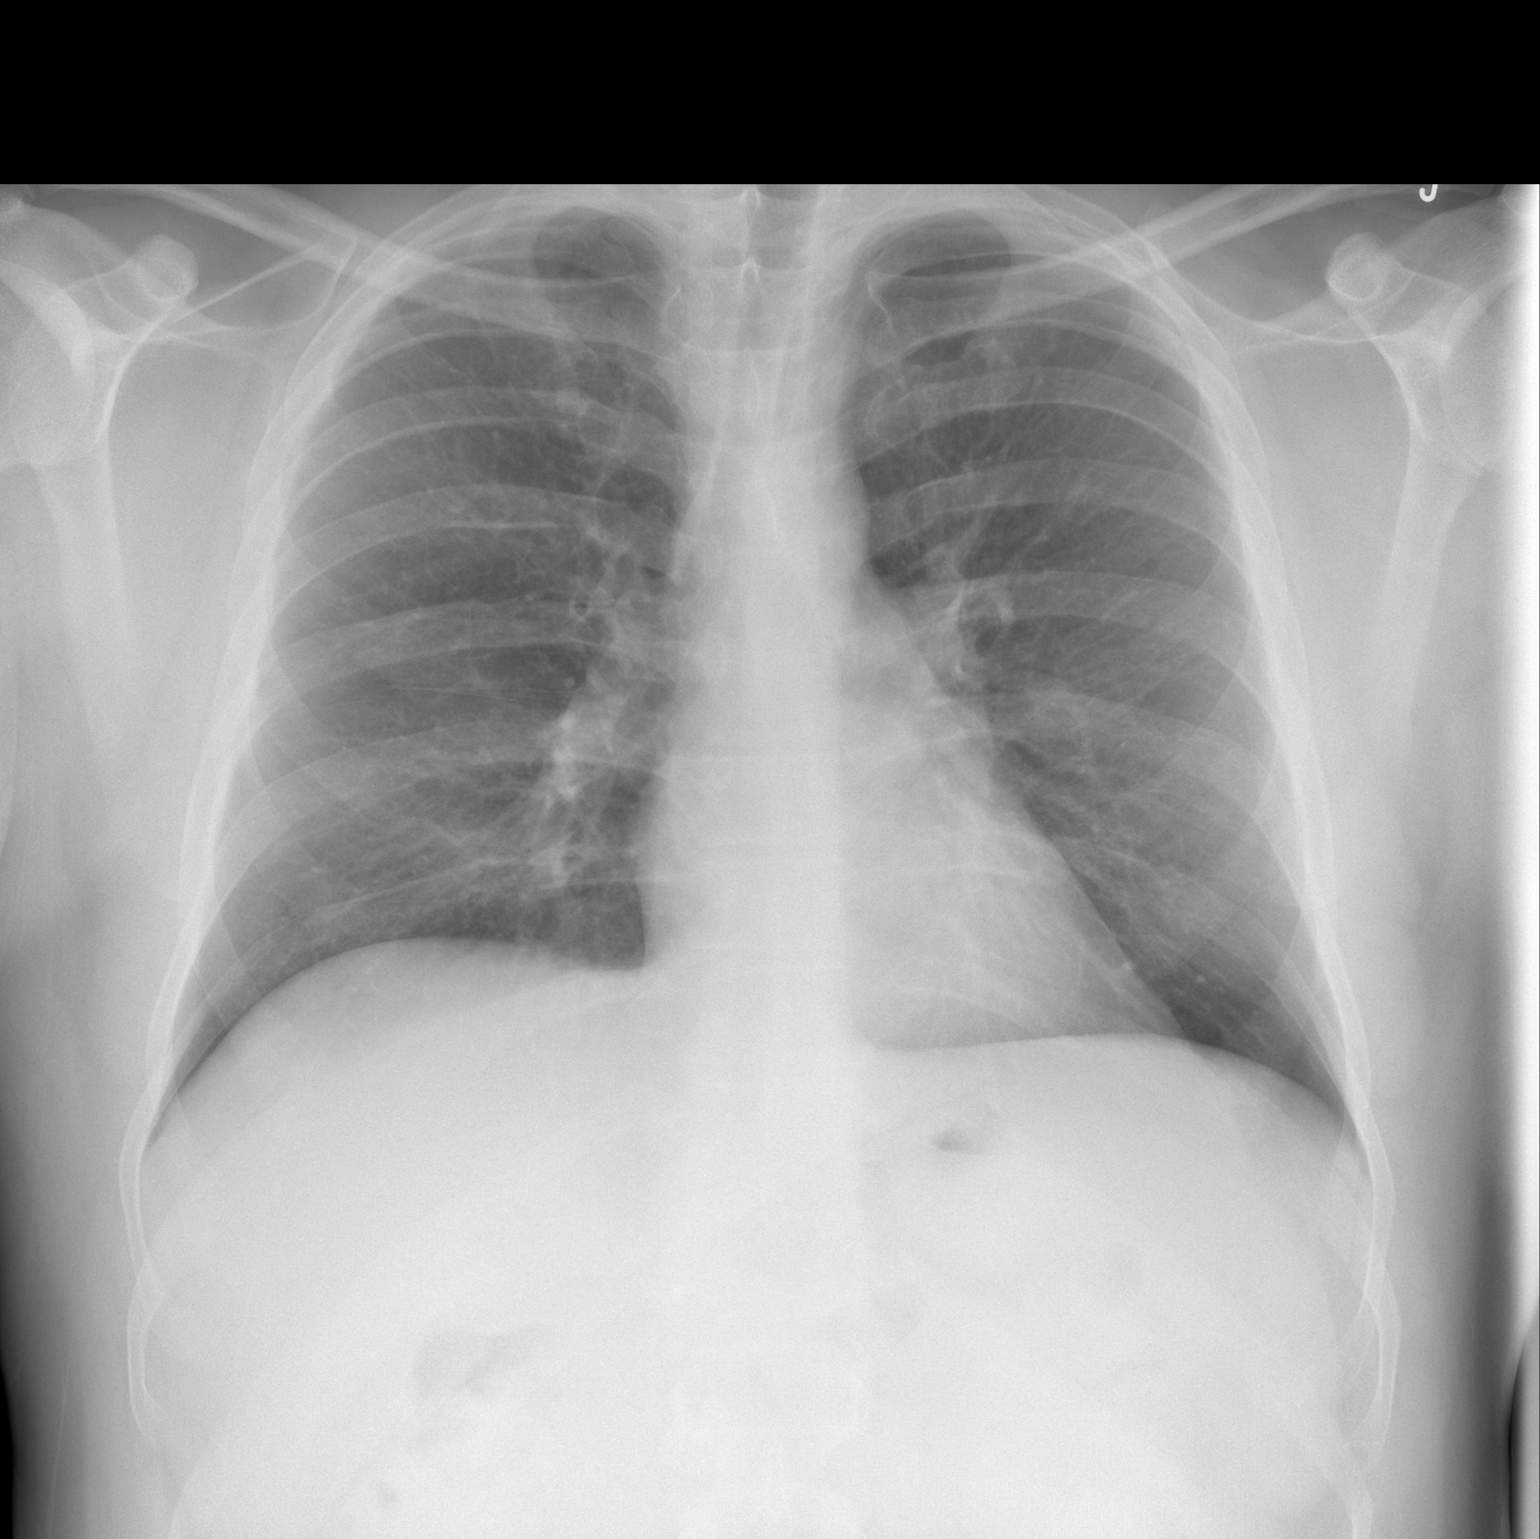

[w chest lat]
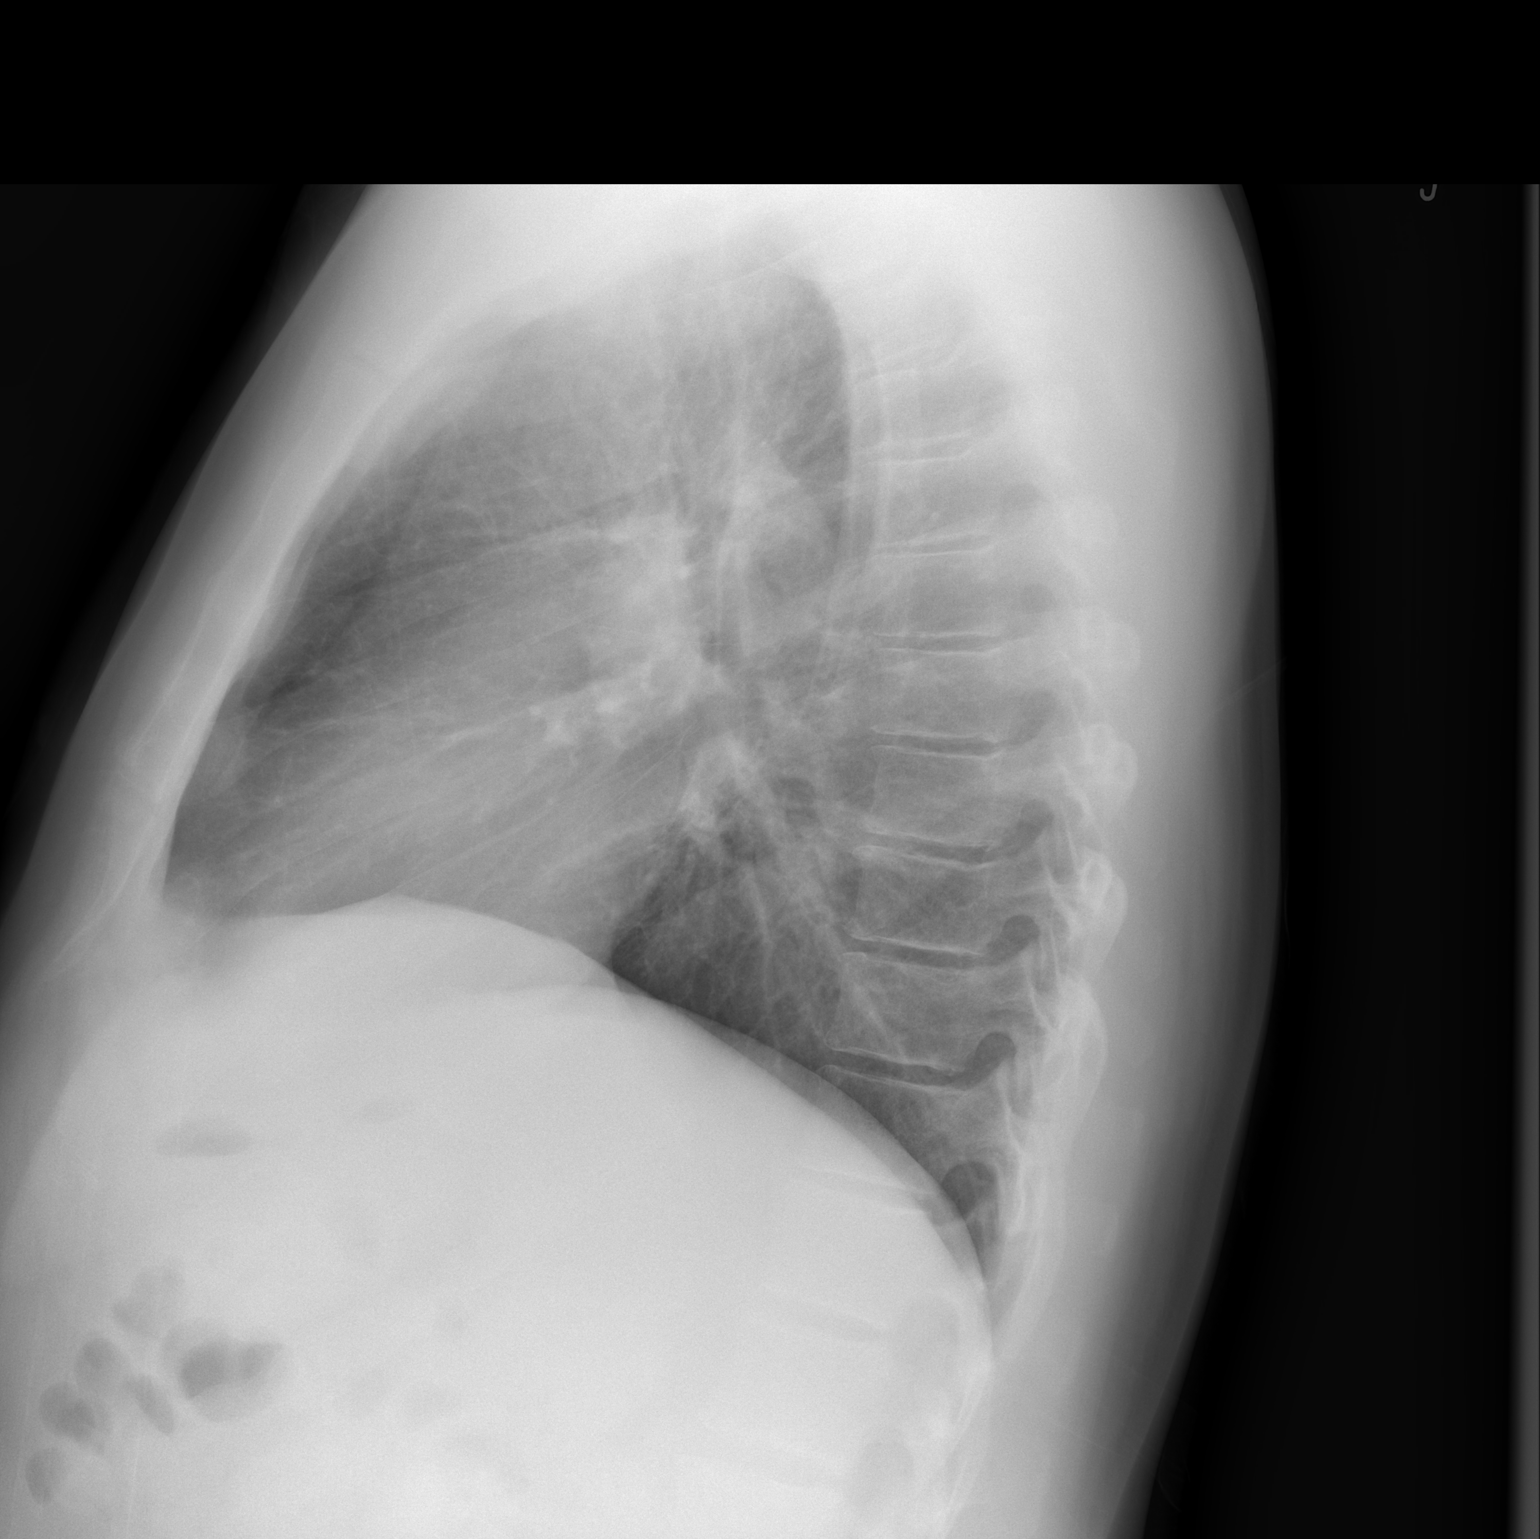

[2 of 2 positions shown; findings below may reference images not displayed]

FINDINGS: The heart size and mediastinal contours are within normal
limits.  Both lungs are clear.  The visualized skeletal structures
are unremarkable.
IMPRESSION: Negative exam.

## 2021-02-26 ENCOUNTER — Encounter (HOSPITAL_COMMUNITY): Payer: Self-pay

## 2021-02-26 ENCOUNTER — Emergency Department (HOSPITAL_COMMUNITY)
Admission: EM | Admit: 2021-02-26 | Discharge: 2021-02-26 | Disposition: A | Discharge status: Home / Self Care | Payer: Self-pay | Attending: Emergency Medicine | Admitting: Emergency Medicine

## 2021-02-26 DIAGNOSIS — L0211 Cutaneous abscess of neck: Secondary | ICD-10-CM | POA: Insufficient documentation

## 2021-02-26 DIAGNOSIS — L0291 Cutaneous abscess, unspecified: Secondary | ICD-10-CM

## 2021-02-26 DIAGNOSIS — Z79899 Other long term (current) drug therapy: Secondary | ICD-10-CM | POA: Insufficient documentation

## 2021-02-26 MED ORDER — SULFAMETHOXAZOLE-TRIMETHOPRIM 800-160 MG PO TABS: 1.0000 | ORAL_TABLET | Freq: Two times a day (BID) | ORAL | 0 refills | Status: AC

## 2021-02-26 NOTE — Discharge Instructions (Addendum)
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 Increased risk for these type of abscesses.  Your antibiotics are at your pharmacy.

## 2021-02-26 NOTE — ED Triage Notes (Signed)
Pt states he has abcess x2 on the back of his neck. States he attempted to lance them himself this morning with little effect. Denies any fevers.

## 2021-02-26 NOTE — ED Provider Notes (Signed)
[facility] DEPT Provider Note   CSN: [id number] Arrival date & time: 02/26/21  3382     History  Chief Complaint  Patient presents with   Abscess    Sean Mccann is a 51 y.o. male presenting with a complaint of a neck abscess for 4 days.  Reports he tried to open it up this morning and got a small amount of yellow fluid out.  Denies any fevers or chills.  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  Reports that he stopped for a while however he began to use a week ago heavily.  Says that the abscess popped up 2 days after he restarted.   Home Medications Prior to Admission medications   Medication Sig Start Date End Date Taking? Authorizing Provider  sulfamethoxazole-trimethoprim (BACTRIM DS) 800-160 MG tablet Take 1 tablet by mouth 2 (two) times daily for 7 days. 02/26/21 03/05/21 Yes Bevan Vu A, PA-C  buPROPion (WELLBUTRIN XL) 300 MG 24 hr tablet Take 300 mg by mouth daily before breakfast.    [provider]  lisinopril (PRINIVIL,ZESTRIL) 10 MG tablet Take 10 mg by mouth daily before breakfast.    [provider]  Melatonin 3 MG TBDP Take by mouth.    [provider]  methocarbamol (ROBAXIN) 500 MG tablet Take 1 tablet (500 mg total) by mouth 3 (three) times daily. 11/29/11   Susa Day, MD  oxyCODONE-acetaminophen (PERCOCET) 7.5-325 MG per tablet Take 1-2 tablets by mouth every 4 (four) hours as needed for pain. 11/29/11   Susa Day, MD  ranitidine (ZANTAC) 75 MG tablet Take 75 mg by mouth 2 (two) times daily.    [provider]      Allergies    Patient has no known allergies.    Review of Systems   Review of Systems See HPI Physical Exam Updated Vital Signs BP (!) 168/106 (BP Location: Left Arm)    Pulse 86    Temp 97.9 F (36.6 C) (Oral)    Resp 20    SpO2 100%  Physical Exam Vitals and nursing note reviewed.  Constitutional:      Appearance: Normal appearance.  HENT:      Head: Normocephalic and atraumatic.     Comments: Dime sized abscess without fluctuance but with surrounding erythema to the patient's left neck right below the hairline Eyes:     General: No scleral icterus.    Conjunctiva/sclera: Conjunctivae normal.  Neck:     Comments: 3x3 abscess on patient's posterior neck.  No fluctuance.  Scabbing over attempted puncture site. Pulmonary:     Effort: Pulmonary effort is normal. No respiratory distress.  Skin:    Findings: No rash.  Neurological:     Mental Status: He is alert.  Psychiatric:        Mood and Affect: Mood normal.    ED Results / Procedures / Treatments   Labs (all labs ordered are listed, but only abnormal results are displayed) Labs Reviewed - No data to display  EKG None  Radiology No results found.  Procedures Procedures   Medications Ordered in ED Medications - No data to display  ED Course/ Medical Decision Making/ A&P                           Medical Decision Making Risk Prescription drug management.   51 year old male presenting with a complaint of an abscess.  Tried to pop it open this  morning however he was unable to get much fluid.  States it is painful.  On physical exam, there is no drainable abscess.  No fluctuance or appropriate area for I&D.  Skin appears somewhat cellulitic, patient will be started on Bactrim.  He reports that this happened 1 time in the past and Bactrim helped him.  He will use ibuprofen for any pain.  We discussed the importance of avoiding IV drugs due to the increased risk of this.  He says that he will do his best to get clean.  Denied the need for resources and states he was recently in rehab and can contact them.   Final Clinical Impression(s) / ED Diagnoses Final diagnoses:  Abscess    Rx / DC Orders ED Discharge Orders          Ordered    sulfamethoxazole-trimethoprim (BACTRIM DS) 800-160 MG tablet  2 times daily        02/26/21 1125           Results and  diagnoses were explained to the patient. Return precautions discussed in full. Patient had no additional questions and expressed complete understanding.   This chart was dictated using voice recognition software.  Despite best efforts to proofread,  errors can occur which can change the documentation meaning.    Darliss Ridgel 02/26/21 1145    Isla Pence, MD 02/26/21 1155

## 2023-03-13 ENCOUNTER — Other Ambulatory Visit: Payer: Self-pay
